# Patient Record
Sex: Male | Born: 1960 | Race: White | Hispanic: No | Smoking: Never smoker
Health system: Southern US, Community
[De-identification: ages and names within clinical notes are randomized; demographics above are authoritative.]

## PROBLEM LIST (undated history)

## (undated) HISTORY — PX: OTHER SURGICAL HISTORY: SHX169

---

## 2008-01-18 ENCOUNTER — Emergency Department (HOSPITAL_COMMUNITY): Admission: EM | Admit: 2008-01-18 | Discharge: 2008-01-18 | Payer: Self-pay | Admitting: Emergency Medicine

## 2009-06-14 ENCOUNTER — Emergency Department (HOSPITAL_COMMUNITY): Admission: EM | Admit: 2009-06-14 | Discharge: 2009-06-14 | Payer: Self-pay | Admitting: Emergency Medicine

## 2009-10-14 DEATH — deceased

## 2010-11-11 ENCOUNTER — Emergency Department (HOSPITAL_COMMUNITY)
Admission: EM | Admit: 2010-11-11 | Discharge: 2010-11-11 | Disposition: A | Discharge status: Home / Self Care | Attending: Emergency Medicine | Admitting: Emergency Medicine

## 2010-11-11 DIAGNOSIS — L600 Ingrowing nail: Secondary | ICD-10-CM | POA: Insufficient documentation

## 2010-11-11 DIAGNOSIS — M79609 Pain in unspecified limb: Secondary | ICD-10-CM | POA: Insufficient documentation

## 2010-11-23 ENCOUNTER — Emergency Department (HOSPITAL_COMMUNITY)
Admission: EM | Admit: 2010-11-23 | Discharge: 2010-11-24 | Disposition: A | Discharge status: Home / Self Care | Attending: Emergency Medicine | Admitting: Emergency Medicine

## 2010-11-23 DIAGNOSIS — K5900 Constipation, unspecified: Secondary | ICD-10-CM | POA: Insufficient documentation

## 2013-01-21 ENCOUNTER — Encounter (HOSPITAL_COMMUNITY): Payer: Self-pay | Admitting: Family Medicine

## 2013-01-21 ENCOUNTER — Emergency Department (HOSPITAL_COMMUNITY)
Admission: EM | Admit: 2013-01-21 | Discharge: 2013-01-21 | Disposition: A | Discharge status: Home / Self Care | Payer: BC Managed Care – PPO | Attending: Emergency Medicine | Admitting: Emergency Medicine

## 2013-01-21 DIAGNOSIS — W260XXA Contact with knife, initial encounter: Secondary | ICD-10-CM | POA: Insufficient documentation

## 2013-01-21 DIAGNOSIS — Y929 Unspecified place or not applicable: Secondary | ICD-10-CM | POA: Insufficient documentation

## 2013-01-21 DIAGNOSIS — S61209A Unspecified open wound of unspecified finger without damage to nail, initial encounter: Secondary | ICD-10-CM | POA: Insufficient documentation

## 2013-01-21 DIAGNOSIS — Y9389 Activity, other specified: Secondary | ICD-10-CM | POA: Insufficient documentation

## 2013-01-21 DIAGNOSIS — S61219A Laceration without foreign body of unspecified finger without damage to nail, initial encounter: Secondary | ICD-10-CM

## 2013-01-21 NOTE — ED Notes (Signed)
Patient states he cut his right thumb while cutting a grapefruit.

## 2013-01-21 NOTE — ED Provider Notes (Signed)
   History    CSN: [id number] Arrival date & time 01/21/13  0031  First MD Initiated Contact with Patient 01/21/13 0127     Chief Complaint  Patient presents with  . Extremity Laceration   (Consider location/radiation/quality/duration/timing/severity/associated sxs/prior Treatment) Patient is a 52 y.o. male presenting with hand injury. The history is provided by the patient.  Hand Injury Location:  Finger Time since incident:  1 hour Injury: yes   Mechanism of injury comment:  Cut to the right thumb when trying to cut a grapefruit Finger location:  R thumb Pain details:    Quality: no pain.   Severity:  No pain Chronicity:  New Handedness:  Left-handed Prior injury to area:  No Relieved by: pressure. Worsened by:  Nothing tried  History reviewed. No pertinent past medical history. History reviewed. No pertinent past surgical history. No family history on file. History  Substance Use Topics  . Smoking status: Not on file  . Smokeless tobacco: Not on file  . Alcohol Use: No    Review of Systems  All other systems reviewed and are negative.    Allergies  Review of patient's allergies indicates no known allergies.  Home Medications   Current Outpatient Rx  Name  Route  Sig  Dispense  Refill  . acetaminophen (TYLENOL) 325 MG tablet   Oral   Take 650 mg by mouth every 6 (six) hours as needed for pain.          BP 120/84  Pulse 85  Temp(Src) 98.7 F (37.1 C) (Oral)  Resp 18  Ht 6' (1.829 m)  Wt 160 lb (72.576 kg)  BMI 21.7 kg/m2  SpO2 100% Physical Exam  Nursing note and vitals reviewed. Constitutional: He appears well-developed and well-nourished. No distress.  HENT:  Head: Normocephalic and atraumatic.  Eyes: EOM are normal. Pupils are equal, round, and reactive to light.  Cardiovascular: Normal rate.   Musculoskeletal:       Right hand: He exhibits normal range of motion, no tenderness, no bony tenderness, normal capillary refill and no deformity.  Normal sensation noted. Normal strength noted.       Hands: Small skin avulsion of the thumb without deep laceration.  Skin: Skin is warm and dry. No rash noted. No erythema.  Psychiatric: He has a normal mood and affect. His behavior is normal.    ED Course  Procedures (including critical care time) Labs Reviewed - No data to display No results found. LACERATION REPAIR Performed by: Gwyneth Sprout Authorized byGwyneth Sprout Consent: Verbal consent obtained. Risks and benefits: risks, benefits and alternatives were discussed Consent given by: patient Patient identity confirmed: provided demographic data Prepped and Draped in normal sterile fashion Wound explored  Laceration Location: right thumb  Laceration Length: 1cm  No Foreign Bodies seen or palpated  Anesthesia: none Irrigation method: syringe Amount of cleaning: standard  Skin closure: dermabond   Patient tolerance: Patient tolerated the procedure well with no immediate complications.  1. Finger laceration, initial encounter     MDM   Patient with skin avulsion to the right thumb without any underlying injury. The wound was Dermabond and tetanus shot is up-to-date. Patient discharged  Gwyneth Sprout, MD 01/21/13 [phone]

## 2015-06-01 ENCOUNTER — Emergency Department (HOSPITAL_COMMUNITY)
Admission: EM | Admit: 2015-06-01 | Discharge: 2015-06-01 | Disposition: A | Discharge status: Home / Self Care | Payer: BC Managed Care – PPO | Attending: Physician Assistant | Admitting: Physician Assistant

## 2015-06-01 ENCOUNTER — Emergency Department (HOSPITAL_COMMUNITY): Payer: BC Managed Care – PPO

## 2015-06-01 DIAGNOSIS — R42 Dizziness and giddiness: Secondary | ICD-10-CM | POA: Diagnosis present

## 2015-06-01 LAB — CBC WITH DIFFERENTIAL/PLATELET
Basophils Absolute: 0 10*3/uL (ref 0.0–0.1)
Basophils Relative: 0 %
Eosinophils Absolute: 0.2 10*3/uL (ref 0.0–0.7)
Eosinophils Relative: 2 %
HCT: 37.8 % — ABNORMAL LOW (ref 39.0–52.0)
Hemoglobin: 13.5 g/dL (ref 13.0–17.0)
Lymphocytes Relative: 12 %
Lymphs Abs: 0.9 10*3/uL (ref 0.7–4.0)
MCH: 32.1 pg (ref 26.0–34.0)
MCHC: 35.7 g/dL (ref 30.0–36.0)
MCV: 90 fL (ref 78.0–100.0)
Monocytes Absolute: 0.4 10*3/uL (ref 0.1–1.0)
Monocytes Relative: 6 %
Neutro Abs: 6 10*3/uL (ref 1.7–7.7)
Neutrophils Relative %: 80 %
Platelets: 124 10*3/uL — ABNORMAL LOW (ref 150–400)
RBC: 4.2 MIL/uL — ABNORMAL LOW (ref 4.22–5.81)
RDW: 12.2 % (ref 11.5–15.5)
WBC: 7.5 10*3/uL (ref 4.0–10.5)

## 2015-06-01 LAB — COMPREHENSIVE METABOLIC PANEL
ALT: 21 U/L (ref 17–63)
AST: 22 U/L (ref 15–41)
Albumin: 4.2 g/dL (ref 3.5–5.0)
Alkaline Phosphatase: 58 U/L (ref 38–126)
Anion gap: 5 (ref 5–15)
BUN: 11 mg/dL (ref 6–20)
CO2: 27 mmol/L (ref 22–32)
Calcium: 9.3 mg/dL (ref 8.9–10.3)
Chloride: 107 mmol/L (ref 101–111)
Creatinine, Ser: 0.84 mg/dL (ref 0.61–1.24)
GFR calc Af Amer: >60 mL/min (ref >60)
GFR calc non Af Amer: >60 mL/min (ref >60)
Glucose, Bld: 154 mg/dL — ABNORMAL HIGH (ref 65–99)
Potassium: 3.8 mmol/L (ref 3.5–5.1)
Sodium: 139 mmol/L (ref 135–145)
Total Bilirubin: 1.7 mg/dL — ABNORMAL HIGH (ref 0.3–1.2)
Total Protein: 6.6 g/dL (ref 6.5–8.1)

## 2015-06-01 MED ORDER — GADOBENATE DIMEGLUMINE 529 MG/ML IV SOLN
20.0000 mL | Freq: Once | INTRAVENOUS | Status: AC | PRN
  Administered 2015-06-01: 15 mL via INTRAVENOUS

## 2015-06-01 MED ORDER — MECLIZINE HCL 50 MG PO TABS: 50.0000 mg | ORAL_TABLET | Freq: Three times a day (TID) | ORAL | Status: DC | PRN

## 2015-06-01 MED ORDER — SODIUM CHLORIDE 0.9 % IV BOLUS (SEPSIS)
1000.0000 mL | Freq: Once | INTRAVENOUS | Status: AC
  Administered 2015-06-01: 1000 mL via INTRAVENOUS

## 2015-06-01 NOTE — ED Provider Notes (Addendum)
CSN: [id number]     Arrival date & time 06/01/15  1522 History   First MD Initiated Contact with Patient 06/01/15 1536     Chief Complaint  Patient presents with  . Dizziness     (Consider location/radiation/quality/duration/timing/severity/associated sxs/prior Treatment) HPI   Patient is a 54 year old male with unrelenting dizziness. Patient difficult to interview because he so distracted by his dizziness. Patient states that it happened on Sunday and then got better Monday and Tuesday and got worse today. He says he woke up and was very bad. He's taken nothing for it. He states he is unsure whether it made worse by moving his head. He is unsure whether its positional. Denies any upper respiratory symptoms recently.  Patient states she's had this in the past. 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 Patient does not see physicians frequently denies any other past medical history.  No past medical history on file. No past surgical history on file. No family history on file. Social History  Substance Use Topics  . Smoking status: Not on file  . Smokeless tobacco: Not on file  . Alcohol Use: No    Review of Systems  Constitutional: Negative for fever and activity change.  HENT: Negative for drooling and hearing loss.   Eyes: Negative for discharge and redness.  Respiratory: Negative for cough and shortness of breath.   Cardiovascular: Negative for chest pain.  Gastrointestinal: Negative for abdominal pain.  Genitourinary: Negative for dysuria.  Musculoskeletal: Negative for arthralgias.  Allergic/Immunologic: Negative for immunocompromised state.  Neurological: Positive for dizziness and light-headedness.  Psychiatric/Behavioral: Negative for agitation.  All other systems reviewed and are negative.     Allergies  Review of patient's allergies indicates no known allergies.  Home Medications   Prior to Admission medications    Medication Sig Start Date End Date Taking? Authorizing Provider  acetaminophen (TYLENOL) 325 MG tablet Take 650 mg by mouth every 6 (six) hours as needed for pain.   Yes Historical Provider, MD  meclizine (ANTIVERT) 50 MG tablet Take 1 tablet (50 mg total) by mouth 3 (three) times daily as needed. 06/01/15   Chaquetta Schlottman Lyn Barrett Goldie, MD   BP 105/78 mmHg  Pulse 80  Temp(Src) 97.9 F (36.6 C) (Oral)  Resp 20  SpO2 98% Physical Exam  Constitutional: He is oriented to person, place, and time. He appears well-nourished.  HENT:  Head: Normocephalic.  Right Ear: External ear normal.  Left Ear: External ear normal.  Mouth/Throat: Oropharynx is clear and moist.  Eyes: Conjunctivae are normal.  Neck: No tracheal deviation present.  Cardiovascular: Normal rate.   Pulmonary/Chest: Effort normal. No stridor. No respiratory distress.  Abdominal: Soft. There is no tenderness.  Musculoskeletal: Normal range of motion. He exhibits no edema.  Neurological: He is oriented to person, place, and time.  Patient has equal pupils. No evidence of nystagmus. Cranial nerves otherwise intact.  Skin: Skin is warm and dry. No rash noted. He is not diaphoretic.  Nursing note and vitals reviewed.   ED Course  Procedures (including critical care time) Labs Review Labs Reviewed  COMPREHENSIVE METABOLIC PANEL - Abnormal; Notable for the following:    Glucose, Bld 154 (*)    Total Bilirubin 1.7 (*)    All other components within normal limits  CBC WITH DIFFERENTIAL/PLATELET - Abnormal; Notable for the following:    RBC 4.20 (*)    HCT 37.8 (*)    Platelets 124 (*)    All  other components within normal limits  I-STAT TROPOININ, ED    Imaging Review Ct Head Wo Contrast  06/01/2015  CLINICAL DATA:  Dizziness starting at 2 p.m. EXAM: CT HEAD WITHOUT CONTRAST TECHNIQUE: Contiguous axial images were obtained from the base of the skull through the vertex without intravenous contrast. COMPARISON:  None. FINDINGS:  Sinuses/Soft tissues: Ethmoid air cell mucosal thickening. Clear mastoid air cells. Intracranial: No mass lesion, hemorrhage, hydrocephalus, acute infarct, intra-axial, or extra-axial fluid collection. IMPRESSION: 1.  No acute intracranial abnormality. 2. Sinus disease. Electronically Signed   By: Jeronimo Greaves M.D.   On: 06/01/2015 16:59   Mr Maxine Glenn Head Wo Contrast  06/01/2015  CLINICAL DATA:  Acute onset dizziness, assess for posterior stroke. Hyperventilation, dizziness, nausea and vomiting for a few days, worse today. Diagnosis of peripheral vertigo, not on medication. EXAM: MR HEAD WITHOUT CONTRAST MR CIRCLE OF WILLIS WITHOUT CONTRAST MRA OF THE NECK WITHOUT AND WITH CONTRAST TECHNIQUE: Multiplanar and multiecho pulse sequences of the neck were obtained without and with intravenous contrast. Angiographic images of the neck were obtained using MRA technique without and with intravenous contrast.; Angiographic images of the Circle of Willis were obtained using MRA technique without intravenous contrast.; Multiplanar, multiecho pulse sequences of the brain and surrounding structures were obtained according to standard protocol without intravenous contrast. CONTRAST:  15mL MULTIHANCE GADOBENATE DIMEGLUMINE 529 MG/ML IV SOLN COMPARISON:  CT head June 01, 2015 at 1642 hours FINDINGS: MR HEAD FINDINGS Multiple sequences are mild to moderately motion degraded. The ventricles and sulci are normal for patient's age. No abnormal parenchymal signal, mass lesions, mass effect. No reduced diffusion to suggest acute ischemia. No susceptibility artifact to suggest hemorrhage. No abnormal extra-axial fluid collections. No extra-axial masses though, contrast enhanced sequences would be more sensitive. Normal major intracranial vascular flow voids seen at the skull base. Ocular globes and orbital contents are unremarkable though not tailored for evaluation. No abnormal sellar expansion. No suspicious calvarial bone marrow  signal. Craniocervical junction maintained. Moderate paranasal sinus mucosal thickening without air-fluid levels. The mastoid air cells are well aerated. MR CIRCLE OF WILLIS FINDINGS Noisy image quality, possible RF interference. Anterior circulation: Normal flow related enhancement of the included cervical, petrous, cavernous and supra clinoid internal carotid arteries. Patent anterior communicating artery. Normal flow related enhancement of the anterior and middle cerebral arteries, including more distal segments. No large vessel occlusion, high-grade stenosis, abnormal luminal irregularity, aneurysm. Posterior circulation: Basilar artery is patent, with normal flow related enhancement of the main branch vessels. Normal flow related enhancement of the posterior cerebral arteries. No large vessel occlusion, high-grade stenosis, abnormal luminal irregularity, aneurysm. MRA NECK FINDINGS Source images and MIP image were reviewed. The common carotid arteries are widely patent bilaterally. The carotid bifurcation is patent bilaterally and there is no significant carotid stenosis. Both vertebral arteries and patent to the basilar without significant vertebral stenosis. There is no evidence for atherosclerosis or flow limiting stenosis. IMPRESSION: Normal mild to moderately motion degraded MRI of the brain. Normal MRA head, given noisy image quality. Normal MRA neck. Electronically Signed   By: Awilda Metro M.D.   On: 06/01/2015 22:12   Mr Angiogram Neck W Wo Contrast  06/01/2015  CLINICAL DATA:  Acute onset dizziness, assess for posterior stroke. Hyperventilation, dizziness, nausea and vomiting for a few days, worse today. Diagnosis of peripheral vertigo, not on medication. EXAM: MR HEAD WITHOUT CONTRAST MR CIRCLE OF WILLIS WITHOUT CONTRAST MRA OF THE NECK WITHOUT AND WITH CONTRAST TECHNIQUE: Multiplanar and multiecho  pulse sequences of the neck were obtained without and with intravenous contrast. Angiographic  images of the neck were obtained using MRA technique without and with intravenous contrast.; Angiographic images of the Circle of Willis were obtained using MRA technique without intravenous contrast.; Multiplanar, multiecho pulse sequences of the brain and surrounding structures were obtained according to standard protocol without intravenous contrast. CONTRAST:  15mL MULTIHANCE GADOBENATE DIMEGLUMINE 529 MG/ML IV SOLN COMPARISON:  CT head June 01, 2015 at 1642 hours FINDINGS: MR HEAD FINDINGS Multiple sequences are mild to moderately motion degraded. The ventricles and sulci are normal for patient's age. No abnormal parenchymal signal, mass lesions, mass effect. No reduced diffusion to suggest acute ischemia. No susceptibility artifact to suggest hemorrhage. No abnormal extra-axial fluid collections. No extra-axial masses though, contrast enhanced sequences would be more sensitive. Normal major intracranial vascular flow voids seen at the skull base. Ocular globes and orbital contents are unremarkable though not tailored for evaluation. No abnormal sellar expansion. No suspicious calvarial bone marrow signal. Craniocervical junction maintained. Moderate paranasal sinus mucosal thickening without air-fluid levels. The mastoid air cells are well aerated. MR CIRCLE OF WILLIS FINDINGS Noisy image quality, possible RF interference. Anterior circulation: Normal flow related enhancement of the included cervical, petrous, cavernous and supra clinoid internal carotid arteries. Patent anterior communicating artery. Normal flow related enhancement of the anterior and middle cerebral arteries, including more distal segments. No large vessel occlusion, high-grade stenosis, abnormal luminal irregularity, aneurysm. Posterior circulation: Basilar artery is patent, with normal flow related enhancement of the main branch vessels. Normal flow related enhancement of the posterior cerebral arteries. No large vessel occlusion,  high-grade stenosis, abnormal luminal irregularity, aneurysm. MRA NECK FINDINGS Source images and MIP image were reviewed. The common carotid arteries are widely patent bilaterally. The carotid bifurcation is patent bilaterally and there is no significant carotid stenosis. Both vertebral arteries and patent to the basilar without significant vertebral stenosis. There is no evidence for atherosclerosis or flow limiting stenosis. IMPRESSION: Normal mild to moderately motion degraded MRI of the brain. Normal MRA head, given noisy image quality. Normal MRA neck. Electronically Signed   By: Awilda Metroourtnay  Bloomer M.D.   On: 06/01/2015 22:12   Mr Brain Wo Contrast  06/01/2015  CLINICAL DATA:  Acute onset dizziness, assess for posterior stroke. Hyperventilation, dizziness, nausea and vomiting for a few days, worse today. Diagnosis of peripheral vertigo, not on medication. EXAM: MR HEAD WITHOUT CONTRAST MR CIRCLE OF WILLIS WITHOUT CONTRAST MRA OF THE NECK WITHOUT AND WITH CONTRAST TECHNIQUE: Multiplanar and multiecho pulse sequences of the neck were obtained without and with intravenous contrast. Angiographic images of the neck were obtained using MRA technique without and with intravenous contrast.; Angiographic images of the Circle of Willis were obtained using MRA technique without intravenous contrast.; Multiplanar, multiecho pulse sequences of the brain and surrounding structures were obtained according to standard protocol without intravenous contrast. CONTRAST:  15mL MULTIHANCE GADOBENATE DIMEGLUMINE 529 MG/ML IV SOLN COMPARISON:  CT head June 01, 2015 at 1642 hours FINDINGS: MR HEAD FINDINGS Multiple sequences are mild to moderately motion degraded. The ventricles and sulci are normal for patient's age. No abnormal parenchymal signal, mass lesions, mass effect. No reduced diffusion to suggest acute ischemia. No susceptibility artifact to suggest hemorrhage. No abnormal extra-axial fluid collections. No  extra-axial masses though, contrast enhanced sequences would be more sensitive. Normal major intracranial vascular flow voids seen at the skull base. Ocular globes and orbital contents are unremarkable though not tailored for evaluation. No abnormal sellar expansion.  No suspicious calvarial bone marrow signal. Craniocervical junction maintained. Moderate paranasal sinus mucosal thickening without air-fluid levels. The mastoid air cells are well aerated. MR CIRCLE OF WILLIS FINDINGS Noisy image quality, possible RF interference. Anterior circulation: Normal flow related enhancement of the included cervical, petrous, cavernous and supra clinoid internal carotid arteries. Patent anterior communicating artery. Normal flow related enhancement of the anterior and middle cerebral arteries, including more distal segments. No large vessel occlusion, high-grade stenosis, abnormal luminal irregularity, aneurysm. Posterior circulation: Basilar artery is patent, with normal flow related enhancement of the main branch vessels. Normal flow related enhancement of the posterior cerebral arteries. No large vessel occlusion, high-grade stenosis, abnormal luminal irregularity, aneurysm. MRA NECK FINDINGS Source images and MIP image were reviewed. The common carotid arteries are widely patent bilaterally. The carotid bifurcation is patent bilaterally and there is no significant carotid stenosis. Both vertebral arteries and patent to the basilar without significant vertebral stenosis. There is no evidence for atherosclerosis or flow limiting stenosis. IMPRESSION: Normal mild to moderately motion degraded MRI of the brain. Normal MRA head, given noisy image quality. Normal MRA neck. Electronically Signed   By: Awilda Metro M.D.   On: 06/01/2015 22:12   I have personally reviewed and evaluated these images and lab results as part of my medical decision-making.   EKG Interpretation None       EKG HR 64 IV conducition delay..  No acute ischemia.    MDM   Final diagnoses:  Dizziness    Patient is a 54 year old male with history of vertigo presenting today with vertigo. Patient has been constant in nature for the whole day. He is unsure what makes it better or worse. I'm concerned about a posterior event in him given the constant nature of his dizziness. We will get CT, EKG, labs. Low threshold to do MRIs patient does not feel vastly improved.   Patient felt vastly improved with fluids and rest. MRI negative.  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 We'll give meclizine and note for work. Patient is able to stand and ambulate without assistance.   Lariza Cothron Randall An, MD 06/01/15 2315  Delano Frate Randall An, MD 06/01/15 1610  Abelino Derrick, MD 06/01/15 [id number]

## 2015-06-01 NOTE — ED Notes (Signed)
Pt in MRI.

## 2015-06-01 NOTE — ED Notes (Signed)
Pt is back from MRI.

## 2015-06-01 NOTE — ED Notes (Signed)
Patient works at [employer]. He was picked up by EMS at his work place. Patient was hyperventilating and c/o dizziness, nausea, and vomiting.

## 2015-06-01 NOTE — Discharge Instructions (Signed)
Benign Positional Vertigo Vertigo is the feeling that you or your surroundings are moving when they are not. Benign positional vertigo is the most common form of vertigo. The cause of this condition is not serious (is benign). This condition is triggered by certain movements and positions (is positional). This condition can be dangerous if it occurs while you are doing something that could endanger you or others, such as driving.  CAUSES In many cases, the cause of this condition is not known. It may be caused by a disturbance in an area of the inner ear that helps your brain to sense movement and balance. This disturbance can be caused by a viral infection (labyrinthitis), head injury, or repetitive motion. RISK FACTORS This condition is more likely to develop in:  Women.  People who are 50 years of age or older. SYMPTOMS Symptoms of this condition usually happen when you move your head or your eyes in different directions. Symptoms may start suddenly, and they usually last for less than a minute. Symptoms may include:  Loss of balance and falling.  Feeling like you are spinning or moving.  Feeling like your surroundings are spinning or moving.  Nausea and vomiting.  Blurred vision.  Dizziness.  Involuntary eye movement (nystagmus). Symptoms can be mild and cause only slight annoyance, or they can be severe and interfere with daily life. Episodes of benign positional vertigo may return (recur) over time, and they may be triggered by certain movements. Symptoms may improve over time. DIAGNOSIS This condition is usually diagnosed by medical history and a physical exam of the head, neck, and ears. You may be referred to a health care provider who specializes in ear, nose, and throat (ENT) problems (otolaryngologist) or a provider who specializes in disorders of the nervous system (neurologist). You may have additional testing, including:  MRI.  A CT scan.  Eye movement tests. Your  health care provider may ask you to change positions quickly while he or she watches you for symptoms of benign positional vertigo, such as nystagmus. Eye movement may be tested with an electronystagmogram (ENG), caloric stimulation, the Dix-Hallpike test, or the roll test.  An electroencephalogram (EEG). This records electrical activity in your brain.  Hearing tests. TREATMENT Usually, your health care provider will treat this by moving your head in specific positions to adjust your inner ear back to normal. Surgery may be needed in severe cases, but this is rare. In some cases, benign positional vertigo may resolve on its own in 2-4 weeks. HOME CARE INSTRUCTIONS Safety  Move slowly.Avoid sudden body or head movements.  Avoid driving.  Avoid operating heavy machinery.  Avoid doing any tasks that would be dangerous to you or others if a vertigo episode would occur.  If you have trouble walking or keeping your balance, try using a cane for stability. If you feel dizzy or unstable, sit down right away.  Return to your normal activities as told by your health care provider. Ask your health care provider what activities are safe for you. General Instructions  Take over-the-counter and prescription medicines only as told by your health care provider.  Avoid certain positions or movements as told by your health care provider.  Drink enough fluid to keep your urine clear or pale yellow.  Keep all follow-up visits as told by your health care provider. This is important. SEEK MEDICAL CARE IF:  You have a fever.  Your condition gets worse or you develop new symptoms.  Your family or friends   notice any behavioral changes.  Your nausea or vomiting gets worse.  You have numbness or a "pins and needles" sensation. SEEK IMMEDIATE MEDICAL CARE IF:  You have difficulty speaking or moving.  You are always dizzy.  You faint.  You develop severe headaches.  You have weakness in your  legs or arms.  You have changes in your hearing or vision.  You develop a stiff neck.  You develop sensitivity to light.   This information is not intended to replace advice given to you by your health care provider. Make sure you discuss any questions you have with your health care provider.   Document Released: 04/09/2006 Document Revised: 03/23/2015 Document Reviewed: 10/25/2014 Elsevier Interactive Patient Education 2016 [employer]  

## 2018-08-28 ENCOUNTER — Other Ambulatory Visit: Payer: Self-pay

## 2018-08-28 ENCOUNTER — Emergency Department (HOSPITAL_COMMUNITY)
Admission: EM | Admit: 2018-08-28 | Discharge: 2018-08-28 | Disposition: A | Discharge status: Home / Self Care | Payer: BC Managed Care – PPO | Attending: Emergency Medicine | Admitting: Emergency Medicine

## 2018-08-28 ENCOUNTER — Encounter (HOSPITAL_COMMUNITY): Payer: Self-pay

## 2018-08-28 DIAGNOSIS — Z79899 Other long term (current) drug therapy: Secondary | ICD-10-CM | POA: Insufficient documentation

## 2018-08-28 DIAGNOSIS — R42 Dizziness and giddiness: Secondary | ICD-10-CM | POA: Insufficient documentation

## 2018-08-28 LAB — CBC WITH DIFFERENTIAL/PLATELET
Abs Immature Granulocytes: 0.01 10*3/uL (ref 0.00–0.07)
Basophils Absolute: 0 10*3/uL (ref 0.0–0.1)
Basophils Relative: 0 %
Eosinophils Absolute: 0.1 10*3/uL (ref 0.0–0.5)
Eosinophils Relative: 2 %
HCT: 36.5 % — ABNORMAL LOW (ref 39.0–52.0)
Hemoglobin: 12.8 g/dL — ABNORMAL LOW (ref 13.0–17.0)
Immature Granulocytes: 0 %
Lymphocytes Relative: 14 %
Lymphs Abs: 0.8 10*3/uL (ref 0.7–4.0)
MCH: 32.9 pg (ref 26.0–34.0)
MCHC: 35.1 g/dL (ref 30.0–36.0)
MCV: 93.8 fL (ref 80.0–100.0)
Monocytes Absolute: 0.4 10*3/uL (ref 0.1–1.0)
Monocytes Relative: 7 %
Neutro Abs: 4.2 10*3/uL (ref 1.7–7.7)
Neutrophils Relative %: 77 %
Platelets: 119 10*3/uL — ABNORMAL LOW (ref 150–400)
RBC: 3.89 MIL/uL — ABNORMAL LOW (ref 4.22–5.81)
RDW: 12.2 % (ref 11.5–15.5)
WBC: 5.6 10*3/uL (ref 4.0–10.5)
nRBC: 0 % (ref 0.0–0.2)

## 2018-08-28 LAB — COMPREHENSIVE METABOLIC PANEL
ALT: 24 U/L (ref 0–44)
AST: 21 U/L (ref 15–41)
Albumin: 4 g/dL (ref 3.5–5.0)
Alkaline Phosphatase: 53 U/L (ref 38–126)
Anion gap: 4 — ABNORMAL LOW (ref 5–15)
BUN: 9 mg/dL (ref 6–20)
CO2: 25 mmol/L (ref 22–32)
Calcium: 8.7 mg/dL — ABNORMAL LOW (ref 8.9–10.3)
Chloride: 109 mmol/L (ref 98–111)
Creatinine, Ser: 0.77 mg/dL (ref 0.61–1.24)
GFR calc Af Amer: >60 mL/min (ref >60)
GFR calc non Af Amer: >60 mL/min (ref >60)
Glucose, Bld: 120 mg/dL — ABNORMAL HIGH (ref 70–99)
Potassium: 4 mmol/L (ref 3.5–5.1)
Sodium: 138 mmol/L (ref 135–145)
Total Bilirubin: 1 mg/dL (ref 0.3–1.2)
Total Protein: 6.1 g/dL — ABNORMAL LOW (ref 6.5–8.1)

## 2018-08-28 LAB — URINALYSIS, ROUTINE W REFLEX MICROSCOPIC
Bilirubin Urine: NEGATIVE
Glucose, UA: NEGATIVE mg/dL
Hgb urine dipstick: NEGATIVE
Ketones, ur: NEGATIVE mg/dL
Leukocytes,Ua: NEGATIVE
Nitrite: NEGATIVE
Protein, ur: NEGATIVE mg/dL
Specific Gravity, Urine: 1.013 (ref 1.005–1.030)
pH: 8 (ref 5.0–8.0)

## 2018-08-28 MED ORDER — MECLIZINE HCL 25 MG PO TABS: 25.0000 mg | ORAL_TABLET | Freq: Three times a day (TID) | ORAL | 0 refills | Status: AC | PRN

## 2018-08-28 MED ORDER — PROMETHAZINE HCL 25 MG/ML IJ SOLN
25.0000 mg | Freq: Once | INTRAMUSCULAR | Status: AC
  Administered 2018-08-28: 25 mg via INTRAVENOUS
  Filled 2018-08-28: qty 1

## 2018-08-28 MED ORDER — MECLIZINE HCL 25 MG PO TABS
25.0000 mg | ORAL_TABLET | Freq: Once | ORAL | Status: AC
  Administered 2018-08-28: 25 mg via ORAL
  Filled 2018-08-28: qty 1

## 2018-08-28 MED ORDER — ONDANSETRON HCL 4 MG/2ML IJ SOLN
4.0000 mg | Freq: Once | INTRAMUSCULAR | Status: AC
  Administered 2018-08-28: 4 mg via INTRAVENOUS
  Filled 2018-08-28: qty 2

## 2018-08-28 MED ORDER — SODIUM CHLORIDE 0.9 % IV BOLUS
1000.0000 mL | Freq: Once | INTRAVENOUS | Status: AC
  Administered 2018-08-28: 1000 mL via INTRAVENOUS

## 2018-08-28 MED ORDER — PROMETHAZINE HCL 25 MG PO TABS: 25.0000 mg | ORAL_TABLET | Freq: Four times a day (QID) | ORAL | 0 refills | Status: AC | PRN

## 2018-08-28 NOTE — ED Notes (Signed)
ED Provider at bedside. 

## 2018-08-28 NOTE — ED Triage Notes (Signed)
Patient coming from home with complaints of dizziness and N/V. Patient has a history of vertigo and states that he started feeling dizzy around 6 PM. He does not take any medications for his vertigo currently. He states that the vertigo is making him feel nausea and causing him to vomit. Patient has vomited x3.

## 2018-08-28 NOTE — Discharge Instructions (Addendum)
Return here as needed. Follow up with your primary doctor. Increase your fluid intake. °

## 2018-08-29 ENCOUNTER — Emergency Department (HOSPITAL_COMMUNITY)
Admission: EM | Admit: 2018-08-29 | Discharge: 2018-08-29 | Disposition: A | Discharge status: Home / Self Care | Payer: BC Managed Care – PPO | Attending: Emergency Medicine | Admitting: Emergency Medicine

## 2018-08-29 ENCOUNTER — Encounter (HOSPITAL_COMMUNITY): Payer: Self-pay | Admitting: Emergency Medicine

## 2018-08-29 DIAGNOSIS — R42 Dizziness and giddiness: Secondary | ICD-10-CM | POA: Insufficient documentation

## 2018-08-29 DIAGNOSIS — Z5321 Procedure and treatment not carried out due to patient leaving prior to being seen by health care provider: Secondary | ICD-10-CM | POA: Diagnosis not present

## 2018-08-29 NOTE — ED Provider Notes (Signed)
[facility]-EMERGENCY DEPT Provider Note   CSN: [id number] Arrival date & time: 08/28/18  6553     History   Chief Complaint Chief Complaint  Patient presents with  . Emesis  . Dizziness    HPI Daniel Garcia is a 58 y.o. male.  HPI Patient presents to the emergency department with complaints of vertigo.  The patient states he has had this happen in the past.  These feel similar to previous episodes.  He states it started around 6 PM last night.  Patient states position change does seem to make the condition somewhat worse.  The patient states he did not take any medications prior to arrival for her symptoms.  Patient states he has had nausea and vomiting associated with this.  The patient denies chest pain, shortness of breath, headache,blurred vision, neck pain, fever, cough, weakness, numbness, dizziness, anorexia, edema, abdominal pain, diarrhea, rash, back pain, dysuria, hematemesis, bloody stool, near syncope, or syncope. History reviewed. No pertinent past medical history.  There are no active problems to display for this patient.   History reviewed. No pertinent surgical history.      Home Medications    Prior to Admission medications   Medication Sig Start Date End Date Taking? Authorizing Provider  acetaminophen (TYLENOL) 325 MG tablet Take 325 mg by mouth every 6 (six) hours as needed for pain.    Yes [provider]  Cyanocobalamin (B-12 PO) Take 1 tablet by mouth daily.   Yes [provider]  Omega-3 Fatty Acids (OMEGA 3 PO) Take 1 capsule by mouth daily.   Yes [provider]  OVER THE COUNTER MEDICATION Take 1 tablet by mouth daily. States he takes 6 vitamins, only named off two. Says he doesn't take them everyday.   Yes [provider]  meclizine (ANTIVERT) 25 MG tablet Take 1 tablet (25 mg total) by mouth 3 (three) times daily as needed for dizziness. 08/28/18   Casandra Dallaire, Cristal Deer, PA-C  promethazine  (PHENERGAN) 25 MG tablet Take 1 tablet (25 mg total) by mouth every 6 (six) hours as needed for nausea or vomiting. 08/28/18   Charlestine Night, PA-C    Family History History reviewed. No pertinent family history.  Social History Social History   Tobacco Use  . Smoking status: Never Smoker  . Smokeless tobacco: Never Used  Substance Use Topics  . Alcohol use: No  . Drug use: No     Allergies   Patient has no known allergies.   Review of Systems Review of Systems All other systems negative except as documented in the HPI. All pertinent positives and negatives as reviewed in the HPI.  Physical Exam Updated Vital Signs BP 104/74   Pulse (!) 54   Resp 15   Ht 6' (1.829 m)   Wt 74.8 kg   SpO2 93%   BMI 22.38 kg/m   Physical Exam Vitals signs and nursing note reviewed.  Constitutional:      General: He is not in acute distress.    Appearance: He is well-developed.  HENT:     Head: Normocephalic and atraumatic.  Eyes:     Pupils: Pupils are equal, round, and reactive to light.  Neck:     Musculoskeletal: Normal range of motion and neck supple.  Cardiovascular:     Rate and Rhythm: Normal rate and regular rhythm.     Heart sounds: Normal heart sounds. No murmur. No friction rub. No gallop.   Pulmonary:     Effort:  Pulmonary effort is normal. No respiratory distress.     Breath sounds: Normal breath sounds. No wheezing.  Abdominal:     General: Bowel sounds are normal. There is no distension.     Palpations: Abdomen is soft.     Tenderness: There is no abdominal tenderness.  Skin:    General: Skin is warm and dry.     Capillary Refill: Capillary refill takes less than 2 seconds.     Findings: No erythema or rash.  Neurological:     Mental Status: He is alert and oriented to person, place, and time.     Motor: No weakness or abnormal muscle tone.     Coordination: Coordination normal. Finger-Nose-Finger Test and Heel to Shin Test normal.     Gait: Gait  normal.     Deep Tendon Reflexes: Reflexes normal.  Psychiatric:        Behavior: Behavior normal.      ED Treatments / Results  Labs (all labs ordered are listed, but only abnormal results are displayed) Labs Reviewed  COMPREHENSIVE METABOLIC PANEL - Abnormal; Notable for the following components:      Result Value   Glucose, Bld 120 (*)    Calcium 8.7 (*)    Total Protein 6.1 (*)    Anion gap 4 (*)    All other components within normal limits  CBC WITH DIFFERENTIAL/PLATELET - Abnormal; Notable for the following components:   RBC 3.89 (*)    Hemoglobin 12.8 (*)    HCT 36.5 (*)    Platelets 119 (*)    All other components within normal limits  URINALYSIS, ROUTINE W REFLEX MICROSCOPIC - Abnormal; Notable for the following components:   APPearance HAZY (*)    All other components within normal limits    EKG None  Radiology No results found.  Procedures Procedures (including critical care time)  Medications Ordered in ED Medications  sodium chloride 0.9 % bolus 1,000 mL (0 mLs Intravenous Stopped 08/28/18 0910)  promethazine (PHENERGAN) injection 25 mg (25 mg Intravenous Given 08/28/18 0816)  meclizine (ANTIVERT) tablet 25 mg (25 mg Oral Given 08/28/18 0817)  ondansetron (ZOFRAN) injection 4 mg (4 mg Intravenous Given 08/28/18 0817)     Initial Impression / Assessment and Plan / ED Course  I have reviewed the triage vital signs and the nursing notes.  Pertinent labs & imaging results that were available during my care of the patient were reviewed by me and considered in my medical decision making (see chart for details).     Patient was feeling much better after IV fluids along with meclizine and Phenergan.  The patient will be discharged home and given meclizine to take for any further symptoms.  Have advised him to return here as needed for any worsening in his condition.  This point I do not feel he is having a posterior circulation stroke based on his examination  and no focal coordination issues  Final Clinical Impressions(s) / ED Diagnoses   Final diagnoses:  Vertigo    ED Discharge Orders         Ordered    meclizine (ANTIVERT) 25 MG tablet  3 times daily PRN     08/28/18 1240    promethazine (PHENERGAN) 25 MG tablet  Every 6 hours PRN     08/28/18 [address] PA-C 08/29/18 1543    Tegeler, Canary Brim, MD 08/30/18 [phone]

## 2018-08-29 NOTE — ED Triage Notes (Signed)
Pt reports has vertigo. Reports was here Wed for same and prescribed medications. Reports took the meclizine but doesn't seem to help. Denies taking the nausea medication today.

## 2018-08-30 ENCOUNTER — Other Ambulatory Visit: Payer: Self-pay

## 2018-08-30 ENCOUNTER — Encounter (HOSPITAL_COMMUNITY): Payer: Self-pay

## 2018-08-30 ENCOUNTER — Emergency Department (HOSPITAL_COMMUNITY)
Admission: EM | Admit: 2018-08-30 | Discharge: 2018-08-30 | Disposition: A | Discharge status: Home / Self Care | Payer: BC Managed Care – PPO | Attending: Emergency Medicine | Admitting: Emergency Medicine

## 2018-08-30 DIAGNOSIS — Z79899 Other long term (current) drug therapy: Secondary | ICD-10-CM | POA: Insufficient documentation

## 2018-08-30 DIAGNOSIS — R42 Dizziness and giddiness: Secondary | ICD-10-CM | POA: Diagnosis present

## 2018-08-30 DIAGNOSIS — H81399 Other peripheral vertigo, unspecified ear: Secondary | ICD-10-CM | POA: Insufficient documentation

## 2018-08-30 MED ORDER — MECLIZINE HCL 25 MG PO TABS
50.0000 mg | ORAL_TABLET | Freq: Once | ORAL | Status: AC
  Administered 2018-08-30: 50 mg via ORAL
  Filled 2018-08-30: qty 2

## 2018-08-30 MED ORDER — ONDANSETRON HCL 4 MG/2ML IJ SOLN
4.0000 mg | Freq: Once | INTRAMUSCULAR | Status: AC
  Administered 2018-08-30: 4 mg via INTRAVENOUS
  Filled 2018-08-30: qty 2

## 2018-08-30 MED ORDER — SODIUM CHLORIDE 0.9 % IV BOLUS (SEPSIS)
1000.0000 mL | Freq: Once | INTRAVENOUS | Status: AC
Start: 2018-08-30 — End: 2018-08-30
  Administered 2018-08-30: 1000 mL via INTRAVENOUS

## 2018-08-30 MED ORDER — ONDANSETRON 8 MG PO TBDP: 8.0000 mg | ORAL_TABLET | Freq: Three times a day (TID) | ORAL | 0 refills | Status: AC | PRN

## 2018-08-30 MED ORDER — SODIUM CHLORIDE 0.9 % IV SOLN
1000.0000 mL | INTRAVENOUS | Status: DC
  Administered 2018-08-30: 1000 mL via INTRAVENOUS

## 2018-08-30 NOTE — ED Triage Notes (Signed)
Patient states he was seen Wednesday for vertigo and vomiting and was presesterday, but started feeling better so he left.  Today, the patient states he has vomited approx 5-6 times today and unable to keep the meclizine down.

## 2018-08-30 NOTE — ED Provider Notes (Signed)
[facility]-EMERGENCY DEPT Provider Note   CSN: [id number] Arrival date & time: 08/30/18  1325     History   Chief Complaint Chief Complaint  Patient presents with  . Dizziness  . Emesis    HPI Daniel Garcia is a 58 y.o. male.  HPI Pt started having issues with dizziness , vertigo and vomiting Thursday am.  Pt was treated in the ED and felt better.  On Friday when he tried to go to the grocery store the vertigo started again.  The sx would get worse whenever he tried to turn his head.  It was not as bad if he kept still.  Pt came to the ER again but left because of the wait and he started to feel better.  When he woke up this am, the sx started again.  He vomited several times.  Certain movements and positions make the sx worse. He does feel like his right ear is more sensitive to sound.  His speech is fine but his vision feels off.  His balance is off.  He did fall one time. He was prescribed meclizine but he is not sure if it is helping.  He was prescribed anti nausea medications but he did throw up as well.  Pt had similar sx a couple of years ago.  He did have an MRI that was normal back then. History reviewed. No pertinent past medical history.  There are no active problems to display for this patient.   Past Surgical History:  Procedure Laterality Date  . vertigo          Home Medications    Prior to Admission medications   Medication Sig Start Date End Date Taking? Authorizing Provider  acetaminophen (TYLENOL) 325 MG tablet Take 325 mg by mouth every 6 (six) hours as needed for pain.    Yes [provider]  Cyanocobalamin (B-12 PO) Take 1 tablet by mouth daily.   Yes [provider]  meclizine (ANTIVERT) 25 MG tablet Take 1 tablet (25 mg total) by mouth 3 (three) times daily as needed for dizziness. 08/28/18  Yes Lawyer, Cristal Deer, PA-C  Omega-3 Fatty Acids (OMEGA 3 PO) Take 1 capsule by mouth daily.   Yes [provider]  promethazine (PHENERGAN) 25 MG tablet Take 1 tablet (25 mg total) by mouth every 6 (six) hours as needed for nausea or vomiting. 08/28/18  Yes Lawyer, Cristal Deer, PA-C  ondansetron (ZOFRAN ODT) 8 MG disintegrating tablet Take 1 tablet (8 mg total) by mouth every 8 (eight) hours as needed for nausea or vomiting. 08/30/18   Linwood Dibbles, MD    Family History Family History  Problem Relation Age of Onset  . Diabetes Father   . Heart failure Father     Social History Social History   Tobacco Use  . Smoking status: Never Smoker  . Smokeless tobacco: Never Used  Substance Use Topics  . Alcohol use: No  . Drug use: No     Allergies   Patient has no known allergies.   Review of Systems Review of Systems  All other systems reviewed and are negative.    Physical Exam Updated Vital Signs BP 115/84 (BP Location: Left Arm)   Pulse (!) 59   Temp 97.8 F (36.6 C) (Oral)   Resp 17   Ht 1.829 m (6')   Wt 74.8 kg   SpO2 98%   BMI 22.38 kg/m   Physical Exam Vitals signs and nursing note reviewed.  Constitutional:      General: He is not in acute distress.    Appearance: He is well-developed.  HENT:     Head: Normocephalic and atraumatic.     Right Ear: External ear normal.     Left Ear: External ear normal.  Eyes:     General: No scleral icterus.       Right eye: No discharge.        Left eye: No discharge.     Conjunctiva/sclera: Conjunctivae normal.  Neck:     Musculoskeletal: Neck supple.     Trachea: No tracheal deviation.  Cardiovascular:     Rate and Rhythm: Normal rate and regular rhythm.  Pulmonary:     Effort: Pulmonary effort is normal. No respiratory distress.     Breath sounds: Normal breath sounds. No stridor. No wheezing or rales.  Abdominal:     General: Bowel sounds are normal. There is no distension.     Palpations: Abdomen is soft.     Tenderness: There is no abdominal tenderness. There is no guarding or rebound.  Musculoskeletal:         General: No tenderness.  Skin:    General: Skin is warm and dry.     Findings: No rash.  Neurological:     Mental Status: He is alert and oriented to person, place, and time.     Cranial Nerves: No cranial nerve deficit (No facial droop, extraocular movements intact, tongue midline ).     Sensory: No sensory deficit.     Motor: No abnormal muscle tone or seizure activity.     Coordination: Coordination normal.     Comments: No pronator drift bilateral upper extrem, able to hold both legs off bed for 5 seconds, sensation intact in all extremities, no visual field cuts, no left or right sided neglect, normal finger-nose exam bilaterally, no nystagmus noted       ED Treatments / Results   Procedures Procedures (including critical care time)  Medications Ordered in ED Medications  sodium chloride 0.9 % bolus 1,000 mL (1,000 mLs Intravenous New Bag/Given 08/30/18 1704)    Followed by  0.9 %  sodium chloride infusion (1,000 mLs Intravenous New Bag/Given 08/30/18 1705)  ondansetron (ZOFRAN) injection 4 mg (4 mg Intravenous Given 08/30/18 1706)  meclizine (ANTIVERT) tablet 50 mg (50 mg Oral Given 08/30/18 1705)     Initial Impression / Assessment and Plan / ED Course  I have reviewed the triage vital signs and the nursing notes.  Pertinent labs & imaging results that were available during my care of the patient were reviewed by me and considered in my medical decision making (see chart for details).  Clinical Course as of Aug 30 1729  Sat Aug 30, 2018  1657 Discussed MRI with patient.  He does not feel that is necessary.  He has had this before and the MRI was normal.  Will try symptomatic treatment.  Reviewed labs from a few days ago which were normal.   [JK]    Clinical Course User Index [JK] Linwood Dibbles, MD    Patient presented to the emergency room for evaluation of dizziness and persistent vertigo.  Patient describes a clear positional component.  He has a normal neurologic  exam.  We discussed advanced imaging such as MRI however the patient states he has had peripheral vertigo before and has had normal brain MRI in the past and this feels very similar.  Patient was treated with fluids, meclizine and Zofran.  He is feeling better.  Patient appears stable for discharge.  Discussed outpatient follow-up with his primary doctor.  Consider Epley's maneuvers.  Final Clinical Impressions(s) / ED Diagnoses   Final diagnoses:  Peripheral vertigo, unspecified laterality    ED Discharge Orders         Ordered    ondansetron (ZOFRAN ODT) 8 MG disintegrating tablet  Every 8 hours PRN     08/30/18 1730           Linwood DibblesKnapp, Tamar Miano, MD 08/30/18 1732

## 2018-08-30 NOTE — ED Notes (Signed)
Pt is alert and oriented x 4 and is verbally responsive. Pt denies pain at this time. Pt states that he has been having Nausea and dizziness x 2 days.

## 2018-08-30 NOTE — Discharge Instructions (Addendum)
By taking the Zofran instead of the Phenergan for nausea, continue to take the meclizine, rest, avoid sudden movements, try to stay well-hydrated, follow-up with your doctor next week if the symptoms persist.    Please review the instructions on how to do the epley manuevers

## 2018-12-25 ENCOUNTER — Other Ambulatory Visit: Payer: Self-pay | Admitting: Otolaryngology

## 2018-12-25 DIAGNOSIS — IMO0001 Reserved for inherently not codable concepts without codable children: Secondary | ICD-10-CM

## 2018-12-25 DIAGNOSIS — H918X1 Other specified hearing loss, right ear: Secondary | ICD-10-CM

## 2018-12-25 DIAGNOSIS — H8101 Meniere's disease, right ear: Secondary | ICD-10-CM

## 2019-01-15 ENCOUNTER — Other Ambulatory Visit: Payer: Self-pay

## 2019-01-15 ENCOUNTER — Ambulatory Visit
Admission: RE | Admit: 2019-01-15 | Discharge: 2019-01-15 | Disposition: A | Discharge status: Home / Self Care | Payer: BC Managed Care – PPO | Source: Ambulatory Visit | Attending: Otolaryngology | Admitting: Otolaryngology

## 2019-01-15 DIAGNOSIS — IMO0001 Reserved for inherently not codable concepts without codable children: Secondary | ICD-10-CM

## 2019-01-15 DIAGNOSIS — H918X1 Other specified hearing loss, right ear: Secondary | ICD-10-CM

## 2019-01-15 DIAGNOSIS — H8101 Meniere's disease, right ear: Secondary | ICD-10-CM

## 2019-01-15 MED ORDER — GADOBENATE DIMEGLUMINE 529 MG/ML IV SOLN
15.0000 mL | Freq: Once | INTRAVENOUS | Status: AC | PRN
  Administered 2019-01-15: 15 mL via INTRAVENOUS

## 2019-04-01 ENCOUNTER — Ambulatory Visit (INDEPENDENT_AMBULATORY_CARE_PROVIDER_SITE_OTHER): Payer: BC Managed Care – PPO | Admitting: Family Medicine

## 2019-04-01 ENCOUNTER — Other Ambulatory Visit: Payer: Self-pay

## 2019-04-01 ENCOUNTER — Ambulatory Visit (INDEPENDENT_AMBULATORY_CARE_PROVIDER_SITE_OTHER): Payer: BC Managed Care – PPO

## 2019-04-01 ENCOUNTER — Encounter: Payer: Self-pay | Admitting: Family Medicine

## 2019-04-01 DIAGNOSIS — G8929 Other chronic pain: Secondary | ICD-10-CM | POA: Diagnosis not present

## 2019-04-01 DIAGNOSIS — M25562 Pain in left knee: Secondary | ICD-10-CM

## 2019-04-01 NOTE — Progress Notes (Signed)
   Office Visit Note   Patient: Daniel Garcia           Date of Birth: July 26, 1960           MRN: [id number] Visit Date: 04/01/2019 Requested by: Haywood Pao, MD [address] [place],  [place] [address] PCP: Osborne Casco Fransico Him, MD  Subjective: Chief Complaint  Patient presents with  . Left Knee - Pain    HPI: He is here with left knee pain.  History of a ganglion cyst aspirated by Dr. Ninfa Linden several years ago.  He was fine for a while but in the past few months his knee has been bothering him intermittently, especially when going downstairs.  Sometimes it feels unstable when he lands with his left foot.  He has developed some stiffness in his knee with flexion, and he feels like the cyst has returned.  Dr. Ninfa Linden asked me to evaluate with ultrasound.              ROS: No fevers or chills.  All other systems were reviewed and are negative.  Objective: Vital Signs: There were no vitals taken for this visit.  Physical Exam:  General:  Alert and oriented, in no acute distress. Pulm:  Breathing unlabored. Psy:  Normal mood, congruent affect. Skin: No rash or erythema. Left knee: No intra-articular effusion.  Slight tenderness over the lateral joint line but no palpable click with McMurray's.  There is fullness in the proximal lateral calf but is not significantly tender to palpation.  Imaging: Limited diagnostic ultrasound left knee: He has a large cyst which seems to originate from the lateral meniscus.  It measures about 10 cm from proximal to distal, and is about 1/2 cm deep and wide.  I believe he has a peripheral tear of the lateral meniscus as well.  X-Rays:  No bony abnormality, no degenerative changes.   Assessment & Plan: 1.  Left knee pain, suspect lateral meniscus tear with large para meniscal cyst -Discussed options with patient and elected to proceed with MRI scan.  Depending on results, could continue with watchful waiting, could proceed with arthroscopic  debridement and cyst excision, or potentially inject with cortisone and aspirate the cyst.     Procedures: No procedures performed  No notes on file     PMFS History: There are no active problems to display for this patient.  No past medical history on file.  Family History  Problem Relation Age of Onset  . Diabetes Father   . Heart failure Father     Past Surgical History:  Procedure Laterality Date  . vertigo     Social History   Occupational History  . Not on file  Tobacco Use  . Smoking status: Never Smoker  . Smokeless tobacco: Never Used  Substance and Sexual Activity  . Alcohol use: No  . Drug use: No  . Sexual activity: Not Currently

## 2019-04-30 ENCOUNTER — Other Ambulatory Visit: Payer: Self-pay

## 2019-04-30 ENCOUNTER — Ambulatory Visit
Admission: RE | Admit: 2019-04-30 | Discharge: 2019-04-30 | Disposition: A | Discharge status: Home / Self Care | Payer: BC Managed Care – PPO | Source: Ambulatory Visit | Attending: Family Medicine | Admitting: Family Medicine

## 2019-04-30 DIAGNOSIS — M25562 Pain in left knee: Secondary | ICD-10-CM

## 2019-04-30 DIAGNOSIS — G8929 Other chronic pain: Secondary | ICD-10-CM

## 2019-05-01 ENCOUNTER — Telehealth: Payer: Self-pay | Admitting: Family Medicine

## 2019-05-01 NOTE — Telephone Encounter (Signed)
I spoke to the patient about his MRI scan.  He does have a lateral meniscus tear with a para meniscal cyst, but distal to it the larger cyst was not completely visualized.  I discussed this with the radiologist, and it does not definitively appear to communicate with the parameniscal cyst.  He believes it probably comes from the tibiofibular joint.  I discussed options with patient including knee arthroscopy with open excision of parameniscal and proximal leg cyst versus trying another aspiration and injection of the larger cyst.  He would prefer to do aspiration and injection for now.  He will follow-up in the near future to do that.

## 2019-05-06 ENCOUNTER — Ambulatory Visit (INDEPENDENT_AMBULATORY_CARE_PROVIDER_SITE_OTHER): Payer: BC Managed Care – PPO | Admitting: Orthopaedic Surgery

## 2019-05-06 ENCOUNTER — Encounter: Payer: Self-pay | Admitting: Orthopaedic Surgery

## 2019-05-06 ENCOUNTER — Other Ambulatory Visit: Payer: Self-pay

## 2019-05-06 DIAGNOSIS — M67469 Ganglion, unspecified knee: Secondary | ICD-10-CM | POA: Diagnosis not present

## 2019-05-06 NOTE — Progress Notes (Signed)
Office Visit Note   Patient: Daniel Garcia           Date of Birth: 1961/05/13           MRN: [id number] Visit Date: 05/06/2019              Requested by: Haywood Pao, MD [address] [place],  [place] [address] PCP: Haywood Pao, MD   Assessment & Plan: Visit Diagnoses:  1. Ganglion of lower leg     Plan: I was uncertain if I would be able to aspirate his left leg but I did prep the area where we had aspirated before with Betadine and alcohol.  I was then able to place an 18-gauge needle in the area and surprisingly aspirated an abundant amount of gelatinous material consistent with a ganglion cyst.  He was pleased that we were able to do this.  All question concerns were answered and addressed.  Follow-up will be as needed.  Follow-Up Instructions: Return if symptoms worsen or fail to improve.   Orders:  No orders of the defined types were placed in this encounter.  No orders of the defined types were placed in this encounter.     Procedures: No procedures performed   Clinical Data: No additional findings.   Subjective: Chief Complaint  Patient presents with   Left Knee - Follow-up  The patient is someone of seen before.  This was for a ganglion cyst there was in the left leg laterally just distal to the fibular head.  I was able to aspirate a significant amount of gelatinous material consistent with a benign ganglion.  More recently Dr. Junius Roads saw him and assessed this area under ultrasound and did see a multiloculated cystic area that was more difficult to aspirate.  A MRI of his left knee was obtained and it did show a horizontal lateral meniscal tear with a small perilabral cyst.  The patient denies any left knee swelling.  He denies any locking catching his left knee.  He does feel that left leg is slightly weaker than the right leg but he denies any other mechanical symptoms as it relates to the left knee.  He feels like the cyst is present again and  would like me to consider an aspiration.  HPI  Review of Systems He currently denies any headache, chest pain, shortness of breath, fever, chills, nausea, vomiting  Objective: Vital Signs: There were no vitals taken for this visit.  Physical Exam He is alert and orient x3 and in no acute distress Ortho Exam Examination of his left knee shows that he has full range of motion of the left knee.  There is no lateral joint line tenderness.  There is no effusion.  His knee is ligamentously stable.  His McMurray's and Lachman's exam are negative.  Palpation along the left lateral leg shows a slightly palpable mass that is much smaller than what I had felt before but it is present.  It is not hard and is not painful.  There is no induration of the skin and no redness. Specialty Comments:  No specialty comments available.  Imaging: No results found.   PMFS History: There are no active problems to display for this patient.  History reviewed. No pertinent past medical history.  Family History  Problem Relation Age of Onset   Diabetes Father    Heart failure Father     Past Surgical History:  Procedure Laterality Date   vertigo  Social History   Occupational History   Not on file  Tobacco Use   Smoking status: Never Smoker   Smokeless tobacco: Never Used  Substance and Sexual Activity   Alcohol use: No   Drug use: No   Sexual activity: Not Currently

## 2020-08-20 ENCOUNTER — Emergency Department (HOSPITAL_COMMUNITY): Payer: No Typology Code available for payment source

## 2020-08-20 ENCOUNTER — Encounter (HOSPITAL_COMMUNITY): Payer: Self-pay

## 2020-08-20 ENCOUNTER — Emergency Department (HOSPITAL_COMMUNITY)
Admission: EM | Admit: 2020-08-20 | Discharge: 2020-08-21 | Disposition: A | Discharge status: Home / Self Care | Payer: No Typology Code available for payment source | Attending: Emergency Medicine | Admitting: Emergency Medicine

## 2020-08-20 ENCOUNTER — Other Ambulatory Visit: Payer: Self-pay

## 2020-08-20 DIAGNOSIS — S3210XA Unspecified fracture of sacrum, initial encounter for closed fracture: Secondary | ICD-10-CM

## 2020-08-20 DIAGNOSIS — S3991XA Unspecified injury of abdomen, initial encounter: Secondary | ICD-10-CM

## 2020-08-20 DIAGNOSIS — S01311A Laceration without foreign body of right ear, initial encounter: Secondary | ICD-10-CM

## 2020-08-20 DIAGNOSIS — S060X1A Concussion with loss of consciousness of 30 minutes or less, initial encounter: Secondary | ICD-10-CM

## 2020-08-20 DIAGNOSIS — S322XXA Fracture of coccyx, initial encounter for closed fracture: Secondary | ICD-10-CM | POA: Diagnosis not present

## 2020-08-20 DIAGNOSIS — Y9301 Activity, walking, marching and hiking: Secondary | ICD-10-CM | POA: Diagnosis not present

## 2020-08-20 DIAGNOSIS — M25551 Pain in right hip: Secondary | ICD-10-CM | POA: Diagnosis not present

## 2020-08-20 DIAGNOSIS — Y92410 Unspecified street and highway as the place of occurrence of the external cause: Secondary | ICD-10-CM | POA: Insufficient documentation

## 2020-08-20 DIAGNOSIS — S0990XA Unspecified injury of head, initial encounter: Secondary | ICD-10-CM | POA: Diagnosis present

## 2020-08-20 LAB — I-STAT CHEM 8, ED
BUN: 7 mg/dL — ABNORMAL LOW (ref 8–23)
Calcium, Ion: 1.09 mmol/L — ABNORMAL LOW (ref 1.15–1.40)
Chloride: 107 mmol/L (ref 98–111)
Creatinine, Ser: 1 mg/dL (ref 0.61–1.24)
Glucose, Bld: 112 mg/dL — ABNORMAL HIGH (ref 70–99)
HCT: 42 % (ref 39.0–52.0)
Hemoglobin: 14.3 g/dL (ref 13.0–17.0)
Potassium: 4.2 mmol/L (ref 3.5–5.1)
Sodium: 139 mmol/L (ref 135–145)
TCO2: 27 mmol/L (ref 22–32)

## 2020-08-20 LAB — CBC WITH DIFFERENTIAL/PLATELET
Abs Immature Granulocytes: 0.04 K/uL (ref 0.00–0.07)
Basophils Absolute: 0 K/uL (ref 0.0–0.1)
Basophils Relative: 1 %
Eosinophils Absolute: 0.3 K/uL (ref 0.0–0.5)
Eosinophils Relative: 5 %
HCT: 41.5 % (ref 39.0–52.0)
Hemoglobin: 15.1 g/dL (ref 13.0–17.0)
Immature Granulocytes: 1 %
Lymphocytes Relative: 32 %
Lymphs Abs: 1.7 K/uL (ref 0.7–4.0)
MCH: 33.5 pg (ref 26.0–34.0)
MCHC: 36.4 g/dL — ABNORMAL HIGH (ref 30.0–36.0)
MCV: 92 fL (ref 80.0–100.0)
Monocytes Absolute: 0.4 K/uL (ref 0.1–1.0)
Monocytes Relative: 8 %
Neutro Abs: 2.9 K/uL (ref 1.7–7.7)
Neutrophils Relative %: 53 %
Platelets: 141 K/uL — ABNORMAL LOW (ref 150–400)
RBC: 4.51 MIL/uL (ref 4.22–5.81)
RDW: 12 % (ref 11.5–15.5)
WBC: 5.4 K/uL (ref 4.0–10.5)
nRBC: 0 % (ref 0.0–0.2)

## 2020-08-20 LAB — COMPREHENSIVE METABOLIC PANEL WITH GFR
ALT: 28 U/L (ref 0–44)
AST: 35 U/L (ref 15–41)
Albumin: 4 g/dL (ref 3.5–5.0)
Alkaline Phosphatase: 64 U/L (ref 38–126)
Anion gap: 10 (ref 5–15)
BUN: 7 mg/dL — ABNORMAL LOW (ref 8–23)
CO2: 23 mmol/L (ref 22–32)
Calcium: 9.2 mg/dL (ref 8.9–10.3)
Chloride: 104 mmol/L (ref 98–111)
Creatinine, Ser: 1.08 mg/dL (ref 0.61–1.24)
GFR, Estimated: >60 mL/min
Glucose, Bld: 120 mg/dL — ABNORMAL HIGH (ref 70–99)
Potassium: 4.3 mmol/L (ref 3.5–5.1)
Sodium: 137 mmol/L (ref 135–145)
Total Bilirubin: 1.9 mg/dL — ABNORMAL HIGH (ref 0.3–1.2)
Total Protein: 6.5 g/dL (ref 6.5–8.1)

## 2020-08-20 LAB — PROTIME-INR
INR: 1 (ref 0.8–1.2)
Prothrombin Time: 12.5 s (ref 11.4–15.2)

## 2020-08-20 LAB — TROPONIN I (HIGH SENSITIVITY): Troponin I (High Sensitivity): 3 ng/L

## 2020-08-20 LAB — ETHANOL: Alcohol, Ethyl (B): <10 mg/dL (ref <10)

## 2020-08-20 MED ORDER — IOHEXOL 300 MG/ML  SOLN
100.0000 mL | Freq: Once | INTRAMUSCULAR | Status: AC | PRN
  Administered 2020-08-20: 100 mL via INTRAVENOUS

## 2020-08-20 MED ORDER — ONDANSETRON HCL 4 MG/2ML IJ SOLN
4.0000 mg | Freq: Once | INTRAMUSCULAR | Status: AC
  Administered 2020-08-21: 4 mg via INTRAVENOUS
  Filled 2020-08-20: qty 2
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
## 2020-08-20 NOTE — ED Notes (Signed)
Patient transported to CT 
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
## 2020-08-21 MED ORDER — HYDROCODONE-ACETAMINOPHEN 5-325 MG PO TABS: 1.0000 | ORAL_TABLET | Freq: Three times a day (TID) | ORAL | 0 refills | Status: AC | PRN

## 2020-08-21 MED ORDER — LIDOCAINE HCL 2 % IJ SOLN
10.0000 mL | Freq: Once | INTRAMUSCULAR | Status: AC
  Administered 2020-08-21: 200 mg
  Filled 2020-08-21: qty 20

## 2020-08-21 MED ORDER — HYDROCODONE-ACETAMINOPHEN 5-325 MG PO TABS: 1.0000 | ORAL_TABLET | Freq: Three times a day (TID) | ORAL | 0 refills | Status: DC | PRN

## 2020-08-21 MED ORDER — AMOXICILLIN-POT CLAVULANATE 875-125 MG PO TABS: 1.0000 | ORAL_TABLET | Freq: Two times a day (BID) | ORAL | 0 refills | Status: AC

## 2020-08-21 MED ORDER — SODIUM CHLORIDE 0.9 % IV BOLUS
1000.0000 mL | Freq: Once | INTRAVENOUS | Status: AC
  Administered 2020-08-21: 1000 mL via INTRAVENOUS

## 2020-08-21 MED ORDER — FENTANYL CITRATE (PF) 100 MCG/2ML IJ SOLN
50.0000 ug | Freq: Once | INTRAMUSCULAR | Status: AC
  Administered 2020-08-21: 50 ug via INTRAVENOUS
  Filled 2020-08-21: qty 2

## 2020-08-21 MED ORDER — KETOROLAC TROMETHAMINE 15 MG/ML IJ SOLN
15.0000 mg | Freq: Once | INTRAMUSCULAR | Status: AC
  Administered 2020-08-21: 15 mg via INTRAVENOUS
  Filled 2020-08-21: qty 1

## 2020-08-21 MED ORDER — AMOXICILLIN-POT CLAVULANATE 875-125 MG PO TABS: 1.0000 | ORAL_TABLET | Freq: Two times a day (BID) | ORAL | 0 refills | Status: DC

## 2020-08-21 MED ORDER — HYDROCODONE-ACETAMINOPHEN 5-325 MG PO TABS
1.0000 | ORAL_TABLET | Freq: Once | ORAL | Status: AC
  Administered 2020-08-21: 1 via ORAL
  Filled 2020-08-21: qty 1

## 2020-08-21 MED ORDER — AMOXICILLIN-POT CLAVULANATE 875-125 MG PO TABS
1.0000 | ORAL_TABLET | Freq: Once | ORAL | Status: AC
  Administered 2020-08-21: 1 via ORAL
  Filled 2020-08-21: qty 1

## 2020-08-21 MED ORDER — SODIUM CHLORIDE 0.9 % IV BOLUS
2000.0000 mL | Freq: Once | INTRAVENOUS | Status: AC
  Administered 2020-08-21: 2000 mL via INTRAVENOUS
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
## 2020-08-21 NOTE — ED Notes (Addendum)
Pt assisted back in bed

## 2020-08-21 NOTE — ED Notes (Signed)
Pt was assisted standing and walking in the room. Pt complained of pain in the right knee and was unable to walk more than 5 steps. EDP informed.

## 2020-08-21 NOTE — ED Notes (Signed)
EMT attempted to ambulate patient, patient became orthostatic to 50s, MD Cardama made aware, verbal order for 2L NS bolus given

## 2020-08-21 NOTE — Progress Notes (Signed)
Orthopedic Tech Progress Note Patient Details:  Daniel Garcia 12/03/1960 [id number]  Ortho Devices Type of Ortho Device: Crutches,Knee Immobilizer Ortho Device/Splint Location: rle Ortho Device/Splint Interventions: Ordered,Application,Adjustment   Post Interventions Patient Tolerated: Well Instructions Provided: Care of device,Adjustment of device   Trinna Post 08/21/2020, 6:06 AM

## 2020-08-21 NOTE — Discharge Instructions (Addendum)
For pain control you may take at 1000 mg of Tylenol every 8 hours scheduled.  In addition you can take 0.5 to 1 tablet of Norco/Vicodin every 8 hours for pain not controlled with the scheduled Tylenol.  Do not wet your ear laceration for the next 48 hours.  After that you may let soapy water run down the ear.  Do not scrub or agitate the area.  Please change your Xeroform dressing every day.

## 2020-08-22 ENCOUNTER — Encounter: Payer: Self-pay | Admitting: Orthopaedic Surgery

## 2020-08-29 ENCOUNTER — Ambulatory Visit (INDEPENDENT_AMBULATORY_CARE_PROVIDER_SITE_OTHER): Payer: No Typology Code available for payment source | Admitting: Plastic Surgery

## 2020-08-29 ENCOUNTER — Other Ambulatory Visit: Payer: Self-pay

## 2020-08-29 ENCOUNTER — Encounter: Payer: Self-pay | Admitting: Plastic Surgery

## 2020-08-29 DIAGNOSIS — S01311A Laceration without foreign body of right ear, initial encounter: Secondary | ICD-10-CM | POA: Diagnosis not present

## 2020-08-29 DIAGNOSIS — S01319A Laceration without foreign body of unspecified ear, initial encounter: Secondary | ICD-10-CM | POA: Insufficient documentation
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
## 2020-09-02 ENCOUNTER — Telehealth: Payer: Self-pay | Admitting: *Deleted

## 2020-09-02 NOTE — Telephone Encounter (Signed)
Faxed on (08/29/20) recent office notes to [facility]-Er Hagler,RN,CCM)-Medical Case Manager.  Confirmation received.//AB/CMA

## 2020-09-18 IMAGING — MR MR BRAIN/IAC WITHOUT AND WITH CONTRAST
12 of 14 series · 35 of 48 positions shown · IV contrast (multihance)
Comparison: 06/01/2015 MRI of the head.

CLINICAL DATA: 58 y/o  M; right sensorineural hearing loss.

EXAM:
MRI HEAD WITHOUT AND WITH CONTRAST
TECHNIQUE: Multiplanar, multiecho pulse sequences of the brain and surrounding
structures were obtained without and with intravenous contrast.
CONTRAST:  15mL MULTIHANCE GADOBENATE DIMEGLUMINE 529 MG/ML IV SOLN

[Series 2: T1 · sagittal · 5.0mm · 0.45mm/px · 3 of 21 slices shown (1 of 3)]
[im 1/21]
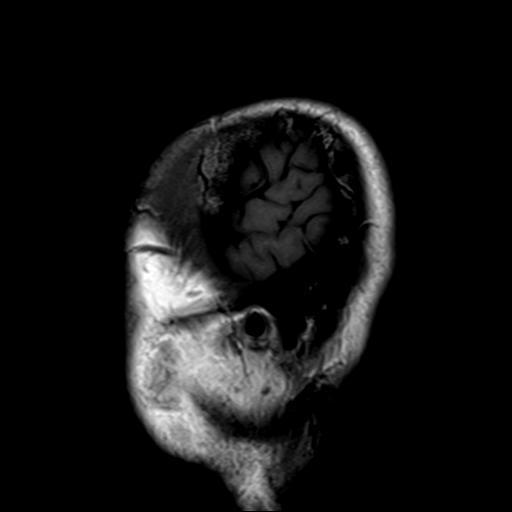
[im 11/21]
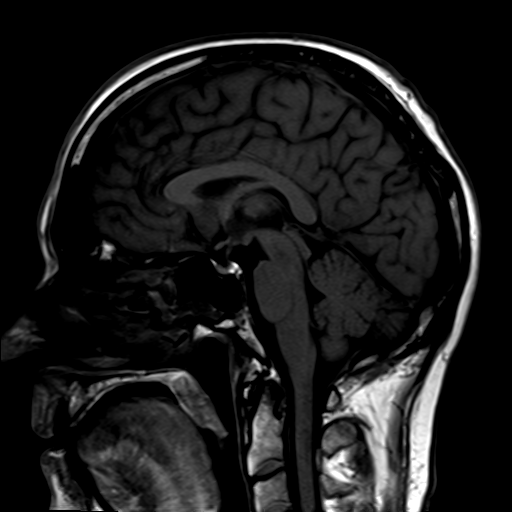
[im 21/21]
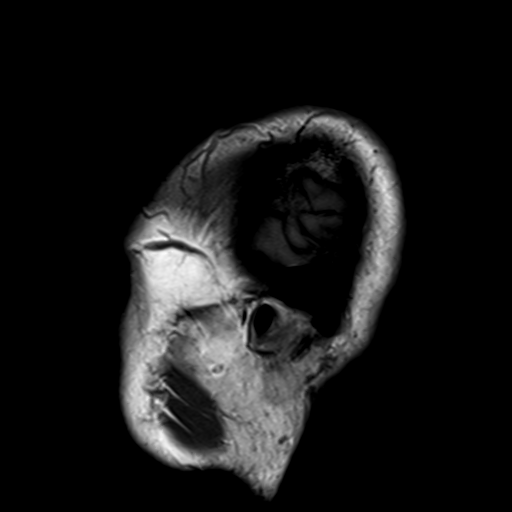

[Series 3: DWI · axial · 3.0mm · 1.80mm/px · z∈[-44,+103]mm · 8 of 100 slices shown (1 of 2)]
[im 1/100]
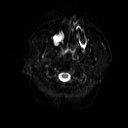
[im 12/100]
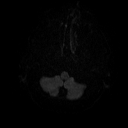
[im 34/100]
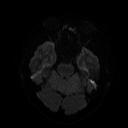
[im 45/100]
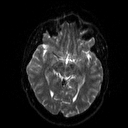
[im 56/100]
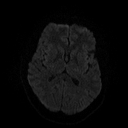
[im 67/100]
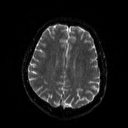
[im 89/100]
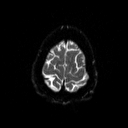
[im 100/100]
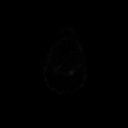

[Series 4: DWI · axial · 3.0mm · 1.80mm/px · z∈[-44,+103]mm · 4 of 44 slices shown (2 of 2)]
[im 1/44]
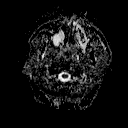
[im 15/44]
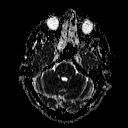
[im 29/44]
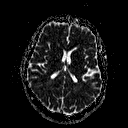
[im 44/44]
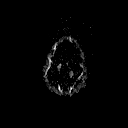

[Series 5: T2 · axial · 5.0mm · 0.51mm/px · z∈[-41,+102]mm · 2 of 23 slices shown (1 of 2)]
[im 1/23]
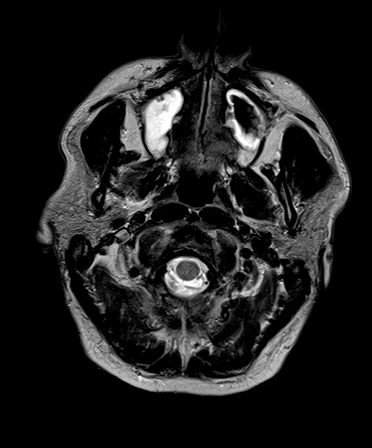
[im 23/23]
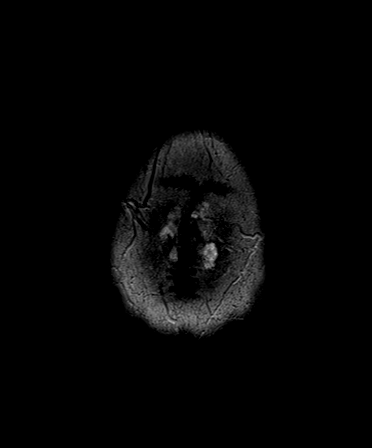

[Series 6: FLAIR · axial · 3.0mm · 0.45mm/px · z∈[-48,+108]mm · 3 of 27 slices shown]
[im 1/27]
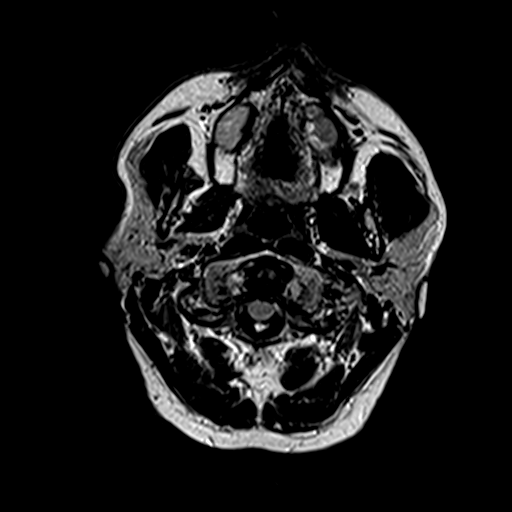
[im 14/27]
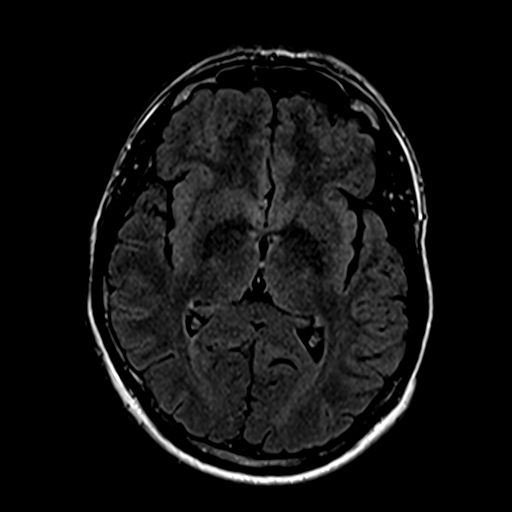
[im 27/27]
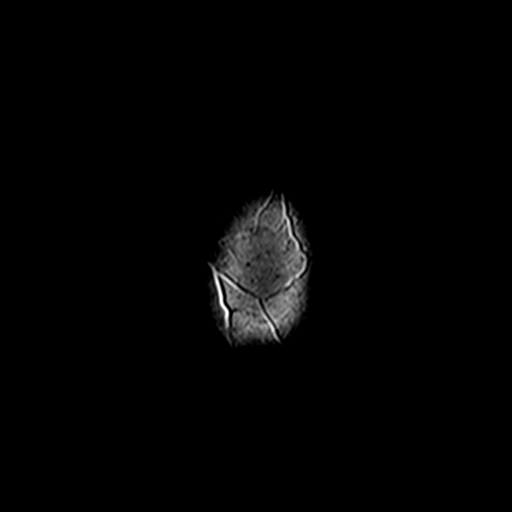

[Series 8: swi_images · axial · 3.0mm · 0.90mm/px · z∈[-40,+101]mm · 5 of 48 slices shown]
[im 1/48]
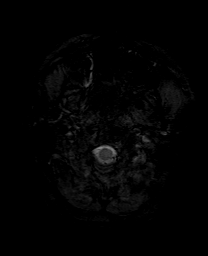
[im 12/48]
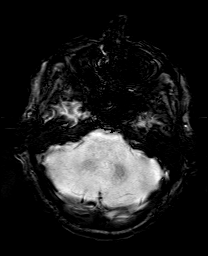
[im 24/48]
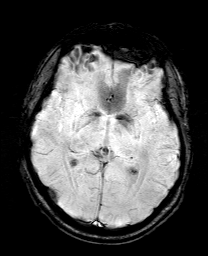
[im 36/48]
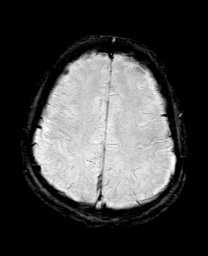
[im 48/48]
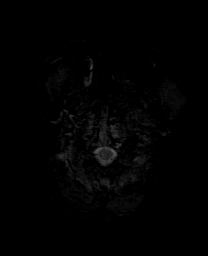

[Series 9: T1 · coronal · 3.0mm · 0.35mm/px · 1 of 11 slices shown (2 of 3)]
[im 1/11]
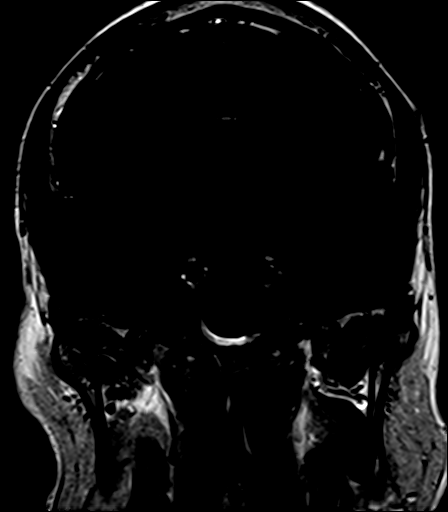

[Series 10: T1 · axial · 3.0mm · 0.35mm/px · 1 of 13 slices shown (3 of 3)]
[im 1/13]
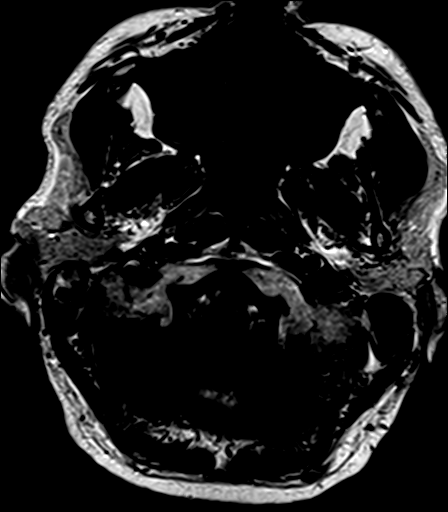

[Series 11: bSSFP · axial · 1.0mm · 0.30mm/px · z∈[-32,-6]mm · 3 of 40 slices shown]
[im 1/40]
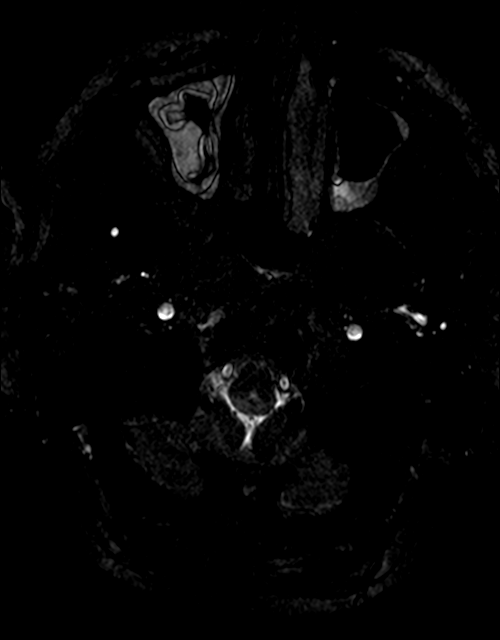
[im 14/40]
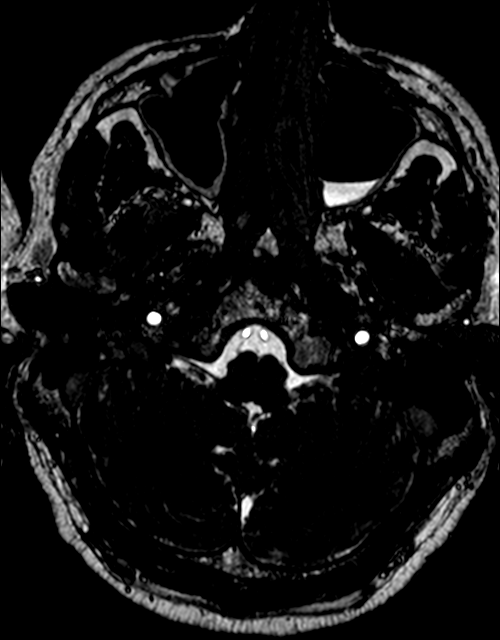
[im 27/40]
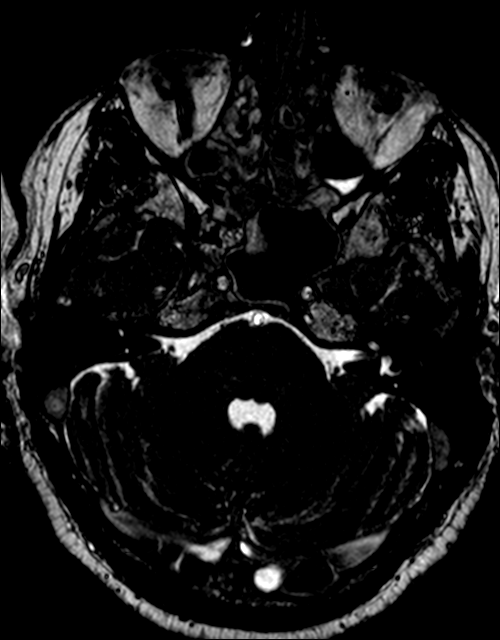

[Series 12: T2 · coronal · 5.0mm · 0.45mm/px · 3 of 25 slices shown (2 of 2)]
[im 1/25]
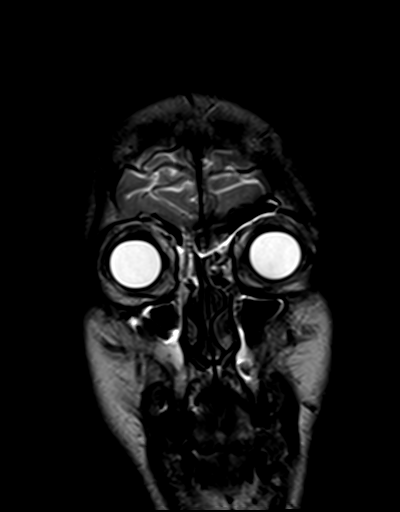
[im 13/25]
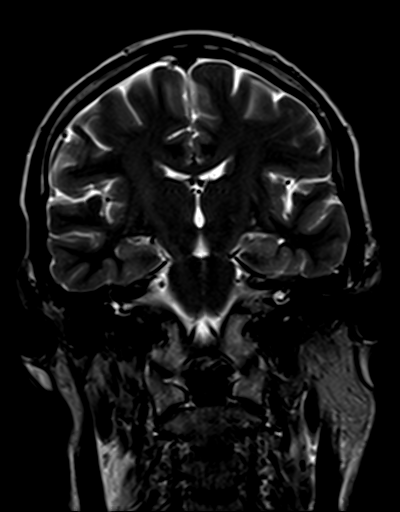
[im 25/25]
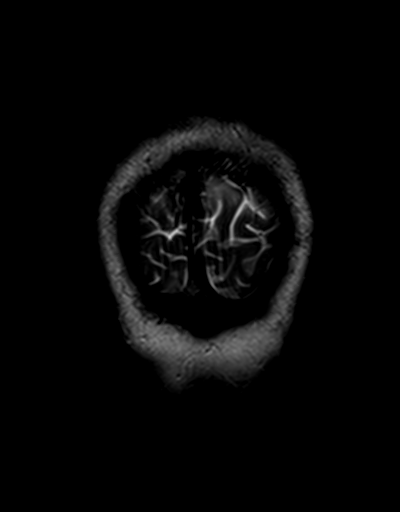

[Series 13: T1 post-contrast · coronal · 3.0mm · 0.35mm/px · 1 of 11 slices shown (1 of 2)]
[im 1/11]
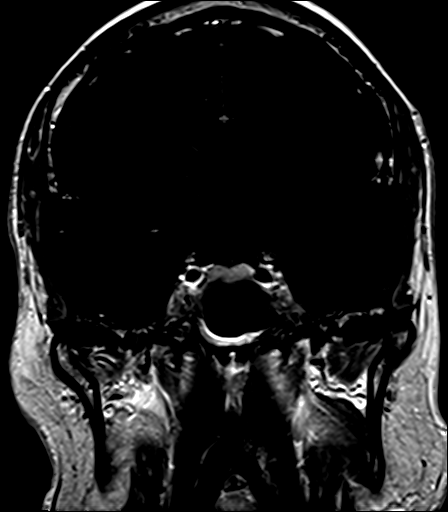

[Series 14: T1 post-contrast · axial · 3.0mm · 0.35mm/px · 1 of 13 slices shown (2 of 2)]
[im 1/13]
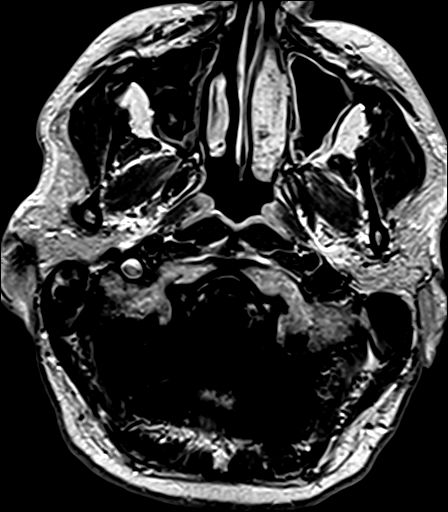

[35 of 48 positions shown; findings below may reference images not displayed]

FINDINGS: Internal auditory canal: No mass or abnormal enhancement. Cranial
nerve 7 and 8 complexes are intact. Inner ear structures are
morphologically normal and demonstrate normal signal. No high-riding
jugular bulb. No neurovascular compression.

Brain: No acute infarction, hemorrhage, hydrocephalus, extra-axial
collection or mass lesion. No structural or signal abnormality
identified. After administration of intravenous contrast there is no
abnormal enhancement.

Vascular: Normal flow voids.

Skull and upper cervical spine: Normal marrow signal.

Sinuses/Orbits: Moderate diffuse paranasal sinus mucosal thickening
greatest in the ethmoid air cells. Left maxillary sinus fluid level.
No abnormal signal of the mastoid air cells. Orbits are
unremarkable.

Other: None.
IMPRESSION: 1. No mass or abnormal enhancement of internal auditory canals.
Inner ear structures are morphologically normal with normal signal.
2. Unremarkable MRI of the brain.
3. Moderate paranasal sinus disease with left maxillary fluid level
which may represent acute sinusitis

## 2024-04-01 ENCOUNTER — Ambulatory Visit: Admitting: Orthopaedic Surgery

## 2024-05-14 ENCOUNTER — Other Ambulatory Visit: Payer: Self-pay

## 2024-05-14 ENCOUNTER — Ambulatory Visit (INDEPENDENT_AMBULATORY_CARE_PROVIDER_SITE_OTHER): Payer: Self-pay | Admitting: Orthopaedic Surgery

## 2024-05-14 ENCOUNTER — Encounter: Payer: Self-pay | Admitting: Orthopaedic Surgery

## 2024-05-14 DIAGNOSIS — G8929 Other chronic pain: Secondary | ICD-10-CM | POA: Diagnosis not present

## 2024-05-14 DIAGNOSIS — M79605 Pain in left leg: Secondary | ICD-10-CM

## 2024-05-14 DIAGNOSIS — M25562 Pain in left knee: Secondary | ICD-10-CM

## 2024-05-14 NOTE — Progress Notes (Signed)
 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  We did aspirate a significant amount of gelatinous material from this area that many years ago.  He also had a MRI of his left knee that showed a horizontal tear of the medial meniscus.  He does still develop left knee stiffness but most of his pain seems to be along the lateral aspect of his tibia.  On exam today there is no knee joint effusion of his left knee.  His range of motion is full but somewhat stiff.  All of his pain seems to be going down the course of the left tibia.  The previous MRI of his left knee was reviewed and there was a horizontal tear of the left lateral meniscus but there was a large cyst thinking to see the top part of the cyst at the proximal third of the lateral tibia.  At this point a MRI of his tibia is warranted to assess the structures along the tibia and assessed for reoccurrence of the cyst so we can come up with a better treatment plan of what is the next step for him.  Right now he is not experiencing significant symptoms of his lateral meniscal tear but certainly is continue to have pain and stiffness and there is likely recurrence of the large cyst that we need to better evaluate at this point with MRI given that this is still going off over 5 years now.  Will see him back in follow-up once we have the MRI of his left tibia.  This can be done without contrast.

## 2024-05-15 ENCOUNTER — Other Ambulatory Visit: Payer: Self-pay

## 2024-05-15 DIAGNOSIS — G8929 Other chronic pain: Secondary | ICD-10-CM

## 2024-05-15 DIAGNOSIS — M79605 Pain in left leg: Secondary | ICD-10-CM

## 2024-05-18 ENCOUNTER — Encounter: Payer: Self-pay | Admitting: Radiology

## 2024-05-27 ENCOUNTER — Ambulatory Visit
Admission: RE | Admit: 2024-05-27 | Discharge: 2024-05-27 | Disposition: A | Discharge status: Home / Self Care | Source: Ambulatory Visit | Attending: Orthopaedic Surgery | Admitting: Orthopaedic Surgery

## 2024-05-27 DIAGNOSIS — M79605 Pain in left leg: Secondary | ICD-10-CM

## 2024-05-27 DIAGNOSIS — G8929 Other chronic pain: Secondary | ICD-10-CM

## 2024-06-10 ENCOUNTER — Encounter: Payer: Self-pay | Admitting: Orthopaedic Surgery

## 2024-06-10 ENCOUNTER — Ambulatory Visit (INDEPENDENT_AMBULATORY_CARE_PROVIDER_SITE_OTHER): Admitting: Orthopaedic Surgery

## 2024-06-10 DIAGNOSIS — M25562 Pain in left knee: Secondary | ICD-10-CM

## 2024-06-10 DIAGNOSIS — G8929 Other chronic pain: Secondary | ICD-10-CM

## 2024-06-10 DIAGNOSIS — M79605 Pain in left leg: Secondary | ICD-10-CM

## 2024-06-10 NOTE — Progress Notes (Signed)
 The patient is a 63 year old active gentleman who comes in today to go over MRI of his left tibia.  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  He says now he is pain-free and has no issues with his leg or knee at all.  He says he did have some stiffness with the cold weather last week but no pain.  The MRI was reviewed with him and it does show some tendinosis and thickening of the proximal aspect of the peroneal longus and brevis tendons.  There may be some chronic tearing of this area but he is asymptomatic.  There is no cyst they can be seen or other worrisome features.  He said this was good news to him and he is asymptomatic completely now.  If he he does develop any issues he knows to reach out and let us  know.  Follow-up can be as needed.
